# Patient Record
Sex: Male | Born: 1955 | Race: Black or African American | Hispanic: No | Marital: Single | Smoking: Never smoker
Health system: Southern US, Community
[De-identification: ages and names within clinical notes are randomized; demographics above are authoritative.]

## PROBLEM LIST (undated history)

## (undated) DIAGNOSIS — M109 Gout, unspecified: Secondary | ICD-10-CM

## (undated) DIAGNOSIS — M549 Dorsalgia, unspecified: Secondary | ICD-10-CM

---

## 2016-11-18 ENCOUNTER — Emergency Department (HOSPITAL_COMMUNITY): Payer: Medicare Other

## 2016-11-18 ENCOUNTER — Encounter (HOSPITAL_COMMUNITY): Payer: Self-pay | Admitting: *Deleted

## 2016-11-18 ENCOUNTER — Emergency Department (HOSPITAL_COMMUNITY)
Admission: EM | Admit: 2016-11-18 | Discharge: 2016-11-18 | Disposition: A | Discharge status: Home / Self Care | Payer: Medicare Other | Attending: Emergency Medicine | Admitting: Emergency Medicine

## 2016-11-18 DIAGNOSIS — M5442 Lumbago with sciatica, left side: Secondary | ICD-10-CM | POA: Diagnosis not present

## 2016-11-18 DIAGNOSIS — M545 Low back pain: Secondary | ICD-10-CM | POA: Diagnosis present

## 2016-11-18 DIAGNOSIS — R2681 Unsteadiness on feet: Secondary | ICD-10-CM | POA: Insufficient documentation

## 2016-11-18 HISTORY — DX: Gout, unspecified: M10.9

## 2016-11-18 MED ORDER — PREDNISONE 20 MG PO TABS
60.0000 mg | ORAL_TABLET | Freq: Once | ORAL | Status: AC
  Administered 2016-11-18: 60 mg via ORAL
  Filled 2016-11-18: qty 3

## 2016-11-18 MED ORDER — HYDROCODONE-ACETAMINOPHEN 5-325 MG PO TABS: 1.0000 | ORAL_TABLET | Freq: Four times a day (QID) | ORAL | 0 refills | Status: DC | PRN

## 2016-11-18 MED ORDER — KETOROLAC TROMETHAMINE 30 MG/ML IJ SOLN
30.0000 mg | Freq: Once | INTRAMUSCULAR | Status: AC
  Administered 2016-11-18: 30 mg via INTRAMUSCULAR
  Filled 2016-11-18: qty 1

## 2016-11-18 MED ORDER — HYDROCODONE-ACETAMINOPHEN 5-325 MG PO TABS
1.0000 | ORAL_TABLET | Freq: Once | ORAL | Status: AC
  Administered 2016-11-18: 1 via ORAL
  Filled 2016-11-18: qty 1

## 2016-11-18 MED ORDER — PREDNISONE 10 MG (21) PO TBPK: ORAL_TABLET | Freq: Every day | ORAL | 0 refills | Status: DC

## 2016-11-18 NOTE — ED Notes (Signed)
Pt reports having back/sciatic nerve pain for over 20 years. HE reports helping his daughter move some things in her yard and thinks that is what aggravated his pain. He reports taking 800 mg ibuprofen with no relief.

## 2016-11-18 NOTE — ED Provider Notes (Signed)
[facility] EMERGENCY DEPARTMENT Provider Note   CSN: [id number] Arrival date & time: 11/18/16  4098     History   Chief Complaint Chief Complaint  Patient presents with  . Back Pain    HPI Trevor Sanchez is a 61 y.o. male with a history of intermittent back and sciatic nerve pain for 20 years presents today for evaluation of acute pain.  He reports that he is visiting from [place] and he helped his daughter move some things in her yard a few days ago.  He reports that since then he has been having a increased pain.  He has been taking ibuprofen 800 without any relief.  He says that normally he gets pain relief from steroids, anti-inflammatories, and narcotic pain medicine.  He reports that his pain radiates down the back of his left leg.  He denies any numbness or tingling of his upper inner thighs or genitals.  No changes to bowel or bladder function.  He denies any personal history of cancer, no recent trauma, and denies history of IV drug use.  He denies any history of high blood pressure.  HPI  Past Medical History:  Diagnosis Date  . Gout     There are no active problems to display for this patient.   History reviewed. No pertinent surgical history.     Home Medications    Prior to Admission medications   Medication Sig Start Date End Date Taking? Authorizing Provider  HYDROcodone-acetaminophen (NORCO/VICODIN) 5-325 MG tablet Take 1 tablet by mouth every 6 (six) hours as needed for severe pain. 11/18/16   Cristina Gong, PA-C  predniSONE (STERAPRED UNI-PAK 21 TAB) 10 MG (21) TBPK tablet Take by mouth daily. Take by mouth 4 tabs for 1 day, then 3 tabs for 1 day, 2 tabs for 1 day, then 1 tab by mouth daily for 1 day 11/18/16   Cristina Gong, PA-C    Family History No family history on file.  Social History Social History  Substance Use Topics  . Smoking status: Never Smoker  . Smokeless tobacco: Never Used  . Alcohol use Yes   Comment: occ     Allergies   Patient has no known allergies.   Review of Systems Review of Systems  Constitutional: Negative for chills and fever.  Gastrointestinal: Negative for abdominal pain, nausea and vomiting.  Musculoskeletal: Positive for back pain and gait problem (Secondary to pain).  Skin: Negative for color change and wound.  Neurological: Negative for weakness and numbness.     Physical Exam Updated Vital Signs BP 135/83 (BP Location: Left Arm)   Pulse 89   Temp 98.5 F (36.9 C) (Oral)   Resp 16   Ht 5\' 8"  (1.727 m)   Wt 93 kg (205 lb)   SpO2 100%   BMI 31.17 kg/m   Physical Exam  Constitutional: He appears well-developed and well-nourished.  Appears uncomfortable anytime he moves  HENT:  Head: Normocephalic and atraumatic.  Cardiovascular: Normal rate and intact distal pulses.   No murmur heard. Bilateral lower extremities are warm perfused.  2+ DP and PT pulses bilaterally.  Abdominal: Soft. Bowel sounds are normal. He exhibits no distension. There is no tenderness.  Musculoskeletal:  Unable to re-create patient's pain with palpation of bilateral lumbar paraspinal muscles or midline lumbar palpation.  He has 5 out of 5 strength to bilateral lower ankles through dorsi and plantar flexion.  Left-sided knee flexion extension, and hip flexion and extension are  limited secondary to reported pain.  Neurological:  Sensation intact to bilateral lower extremities.  Skin: Skin is warm and dry. He is not diaphoretic.  Psychiatric: He has a normal mood and affect. His behavior is normal.  Nursing note and vitals reviewed.    ED Treatments / Results  Labs (all labs ordered are listed, but only abnormal results are displayed) Labs Reviewed - No data to display  EKG  EKG Interpretation None       Radiology Dg Lumbar Spine Complete  Result Date: 11/18/2016 CLINICAL DATA:  Chronic lumbago with left-sided radicular symptoms EXAM: LUMBAR SPINE - COMPLETE  4+ VIEW COMPARISON:  None. FINDINGS: Frontal, lateral, spot lumbosacral lateral, and bilateral oblique views were obtained. There are 5 non-rib-bearing lumbar type vertebral bodies. There is no fracture or spondylolisthesis. There is moderate disc space narrowing at L5-S1 with anterior osteophytes at L5 and S1. Other disc spaces appear normal. There is facet osteoarthritic change at L5-S1 bilaterally. IMPRESSION: Osteoarthritic change at L5-S1.  No fracture or spondylolisthesis. Electronically Signed   By: Bretta Bang III M.D.   On: 11/18/2016 09:39    Procedures Procedures  EMERGENCY DEPARTMENT ULTRASOUND  Study: Limited Retroperitoneal Ultrasound of the Abdominal Aorta.  INDICATIONS:Back pain and Age>55 Multiple views of the abdominal aorta were obtained in real-time from the diaphragmatic hiatus to the aortic bifurcation in transverse planes with a multi-frequency probe.  PERFORMED BY: Myself IMAGES ARCHIVED?: Yes LIMITATIONS:  Body habitus and Bowel gas INTERPRETATION:  No abdominal aortic aneurysm     Medications Ordered in ED Medications  predniSONE (DELTASONE) tablet 60 mg (not administered)  ketorolac (TORADOL) 30 MG/ML injection 30 mg (not administered)  HYDROcodone-acetaminophen (NORCO/VICODIN) 5-325 MG per tablet 1 tablet (not administered)     Initial Impression / Assessment and Plan / ED Course  I have reviewed the triage vital signs and the nursing notes.  Pertinent labs & imaging results that were available during my care of the patient were reviewed by me and considered in my medical decision making (see chart for details).    Patient with back pain.  No neurological deficits and normal neuro exam.  Patient can walk but states is painful.  No loss of bowel or bladder control.  No concern for cauda equina.  No fever, night sweats, weight loss, h/o cancer, IVDU.  Bedside point-of-care abdominal aortic ultrasound was performed without any obvious dilation or  aneurysms present.  These images were reviewed by Dr. Denton Lank who agreed with my interpretation.  RICE protocol and pain medicine indicated and discussed with patient.   Will also discharge with steroid Dosepak.  He was treated with Norco, prednisone, and Toradol in the ED as he appeared uncomfortable.   This patient was seen as a shared visit with Dr.Steinl who agreed with my plan.  Prior to the prescription of narcotic medications the [facility] PMP was queried.  Final Clinical Impressions(s) / ED Diagnoses   Final diagnoses:  Left-sided low back pain with left-sided sciatica, unspecified chronicity    New Prescriptions New Prescriptions   HYDROCODONE-ACETAMINOPHEN (NORCO/VICODIN) 5-325 MG TABLET    Take 1 tablet by mouth every 6 (six) hours as needed for severe pain.   PREDNISONE (STERAPRED UNI-PAK 21 TAB) 10 MG (21) TBPK TABLET    Take by mouth daily. Take by mouth 4 tabs for 1 day, then 3 tabs for 1 day, 2 tabs for 1 day, then 1 tab by mouth daily for 1 day     Cristina Gong,  PA-C 11/18/16 1109    Cathren LaineSteinl, Kevin, MD 11/18/16 [phone]

## 2016-11-18 NOTE — Discharge Instructions (Signed)
Please take Ibuprofen (Advil, motrin) and Tylenol (acetaminophen) to relieve your pain.  You may take up to 600 MG (3 pills) of normal strength ibuprofen every 8 hours as needed.  In between doses of ibuprofen you make take tylenol, up to 1,000 mg (two extra strength pills).  Do not take more than 3,000 mg tylenol in a 24 hour period.  Please check all medication labels as many medications such as pain and cold medications may contain tylenol.  Do not drink alcohol while taking these medications.  Do not take other NSAID'S while taking ibuprofen (such as aleve or naproxen).  Please take ibuprofen with food to decrease stomach upset.  Please do not take any NSAIDs, including ibuprofen, four 12 hours after receiving your Toradol shot.  Today you received medications that may make you sleepy or impair your ability to make decisions.  For the next 24 hours please do not drive, operate heavy machinery, care for a small child with out another adult present, or perform any activities that may cause harm to you or someone else if you were to fall asleep or be impaired.   You are being prescribed a medication which may make you sleepy. Please follow up of listed precautions for at least 24 hours after taking one dose.  Your narcotic pain medication may cause constipation.  Please take a stool softener while taking your Norco.  Please seek emergency medical care if you have any numbness or tingling of your upper inner thighs or genitals, along with any changes to bowel or bladder function.  Please follow-up with your back doctor when you return to [place].  Please be aware that prednisone may make you have an increased appetite.  If you are diabetic it may cause your sugar to elevate.

## 2016-11-18 NOTE — ED Triage Notes (Signed)
Pt states L lower back pain that radiates to L leg.  States difficulty ambulating d/t pain.

## 2017-01-22 ENCOUNTER — Encounter (HOSPITAL_COMMUNITY): Payer: Self-pay | Admitting: Emergency Medicine

## 2017-01-22 ENCOUNTER — Emergency Department (HOSPITAL_COMMUNITY)
Admission: EM | Admit: 2017-01-22 | Discharge: 2017-01-22 | Disposition: A | Discharge status: Home / Self Care | Payer: Medicare Other | Attending: Emergency Medicine | Admitting: Emergency Medicine

## 2017-01-22 DIAGNOSIS — M545 Low back pain: Secondary | ICD-10-CM | POA: Diagnosis present

## 2017-01-22 HISTORY — DX: Dorsalgia, unspecified: M54.9

## 2017-01-22 MED ORDER — PREDNISONE 10 MG (21) PO TBPK: ORAL_TABLET | ORAL | 0 refills | Status: AC

## 2017-01-22 MED ORDER — OXYCODONE-ACETAMINOPHEN 5-325 MG PO TABS
1.0000 | ORAL_TABLET | Freq: Once | ORAL | Status: AC
  Administered 2017-01-22: 1 via ORAL
  Filled 2017-01-22: qty 1

## 2017-01-22 MED ORDER — NAPROXEN 500 MG PO TABS: 500.0000 mg | ORAL_TABLET | Freq: Two times a day (BID) | ORAL | 0 refills | Status: AC

## 2017-01-22 NOTE — ED Provider Notes (Signed)
[facility] Provider Note   CSN: [id number] Arrival date & time: 01/22/17  1311     History   Chief Complaint Chief Complaint  Patient presents with  . Back Pain    HPI Trevor Sanchez is a 61 y.o. male who presents the emergency department complaining of mid to right sided back pain that started this morning. Patient states yesterday he was doing lifting and moving a lot with assisting his daughter around the house. States this morning he woke up with some discomfort that has been progressively worsening, no specific lifting yesterday with sudden onset of pain. Pain is described as being to lower back centrally to R sided. Has tried ibuprofen and a muscle relaxer at home without relief. Pain is worse with movement and walking. States he was seen for similar in the past and prednisone and percocet taken with significant relief of symptoms. Denies numbness, tingling, weakness, incontinence to bowel/bladder, fever, chills, IV drug use, or hx of cancer.   HPI  Past Medical History:  Diagnosis Date  . Back ache   . Gout   . Gout     There are no active problems to display for this patient.   History reviewed. No pertinent surgical history.     Home Medications    Prior to Admission medications   Medication Sig Start Date End Date Taking? Authorizing Provider  HYDROcodone-acetaminophen (NORCO/VICODIN) 5-325 MG tablet Take 1 tablet by mouth every 6 (six) hours as needed for severe pain. 11/18/16   Cristina GongHammond, Elizabeth W, PA-C  predniSONE (STERAPRED UNI-PAK 21 TAB) 10 MG (21) TBPK tablet Take by mouth daily. Take by mouth 4 tabs for 1 day, then 3 tabs for 1 day, 2 tabs for 1 day, then 1 tab by mouth daily for 1 day 11/18/16   Cristina GongHammond, Elizabeth W, PA-C    Family History History reviewed. No pertinent family history.  Social History Social History   Tobacco Use  . Smoking status: Never Smoker  . Smokeless tobacco: Never Used  Substance  Use Topics  . Alcohol use: Yes    Comment: occ  . Drug use: No     Allergies   Patient has no known allergies.   Review of Systems Review of Systems  Constitutional: Negative for chills, fever and unexpected weight change.  Musculoskeletal: Positive for back pain.  Neurological: Negative for weakness and numbness.       Negative for incontinence     Physical Exam Updated Vital Signs BP (!) 149/105 (BP Location: Right Arm)   Pulse 76   Temp 98.9 F (37.2 C) (Oral)   Resp 17   Ht 5\' 8"  (1.727 m)   Wt 93 kg (205 lb)   SpO2 99%   BMI 31.17 kg/m   Physical Exam  Constitutional: He appears well-developed and well-nourished. No distress.  HENT:  Head: Normocephalic and atraumatic.  Eyes: Conjunctivae are normal. Right eye exhibits no discharge. Left eye exhibits no discharge.  Cardiovascular:  Pulses:      Posterior tibial pulses are 2+ on the right side, and 2+ on the left side.  Musculoskeletal:  Back: Patient with diffuse lower lumbar tenderness from midline to right paraspinal. No point tenderness.  No overlying erythema, swelling, ecchymosis, warmth, or obvious deformity.   Neurological: He is alert.  Clear speech. Bilateral lower extremities' sensation intact to sharp and dull touch.. 5/5 plantar and dorsi flexion bilaterally. Patellar DTRs are 2+ and symmetric . Gait is intact.  Psychiatric: He has a normal mood and affect. His behavior is normal. Thought content normal.  Nursing note and vitals reviewed.   ED Treatments / Results  Labs (all labs ordered are listed, but only abnormal results are displayed) Labs Reviewed - No data to display  EKG  EKG Interpretation None       Radiology No results found.  Procedures Procedures (including critical care time)  Medications Ordered in ED Medications - No data to display   Initial Impression / Assessment and Plan / ED Course  I have reviewed the triage vital signs and the nursing notes.  Pertinent  labs & imaging results that were available during my care of the patient were reviewed by me and considered in my medical decision making (see chart for details).   Patient presents with complaint of back pain after being active with lifting yesterday.  Patient is nontoxic-appearing stable vital signs, noted to be  Hypertensive-discussed the patient that he needs to have this rechecked.  Patient with lumbar tenderness midline extending to right side paraspinal muscles, no focal tenderness, doubt fracture or dislocation.  Discussed that I have low suspicion for fracture or dislocation with the patient, we discussed the option of getting an x-ray, he declined at this time and I am in agreement with this.  No neurological deficits and normal neuro exam.  Patient can walk but states is painful.  No loss of bowel or bladder control.  No concern for cauda equina.  No fever, night sweats, weight loss, h/o cancer, IVDU.  Will give one percocet in the ED and treat with prednisone and Naproxen. Instructed patient to take muscle relaxer he has at home and apply heat to the area- discussed he is not to drive or operate heavy machinery with muscle relaxer use. I discussed treatment plan, need for PCP follow-up, and return precautions with the patient. Provided opportunity for questions, patient confirmed understanding and is in agreement with plan.   Final Clinical Impressions(s) / ED Diagnoses   Final diagnoses:  Acute low back pain, unspecified back pain laterality, with sciatica presence unspecified    ED Discharge Orders        Ordered    predniSONE (STERAPRED UNI-PAK 21 TAB) 10 MG (21) TBPK tablet     01/22/17 1508    naproxen (NAPROSYN) 500 MG tablet  2 times daily     01/22/17 [address]1508       Kristiane Morsch, SheridanSamantha R, PA-C 01/22/17 1510    Margarita Grizzleay, Danielle, MD 01/25/17 [phone]

## 2017-01-22 NOTE — ED Triage Notes (Signed)
Pt. Stated, I was helping my daughter move and I HURT MY LOWER BACFK.

## 2017-01-22 NOTE — Discharge Instructions (Signed)
Back Pain:  I have prescribed you an non-steroidal anti-inflammatory medication and a steroid  Naproxen is a nonsteroidal anti-inflammatory medication that will help with pain and swelling. Be sure to take this medication as prescribed with food, 1 pill every 12 hours,  It should be taken with food, as it can cause stomach upset, and more seriously, stomach bleeding. Do not take other nonsteroidal anti-inflammatory medications with this such as Advil, Motrin, or Aleve.   Prednisone is a steroid that will help with pain and inflammation.   Continue to take your prescribed muscle relaxer for pain control as well- keep in mind you cannot driver or operate heavy machinery when taking this.   In addition you may also take Tylenol. Tylenol is generally safe, though you should not take more than 8 of the extra strength (500mg ) pills a day.  The application of heat can help soothe the pain.  Maintaining your daily activities, including walking, is encourged, as it will help you get better faster than just staying in bed.   Your pain should get better over the next 2 weeks.  You will need to follow up with  Your primary healthcare provider in 1 week for reassessment, if you do not have a primary care provider one is provided in your discharge instructions. However if you develop severe or worsening pain, low back pain with fever, numbness, weakness, loss of bowel or bladder control, or inability to walk or urinate, you should return to the ER immediately.  Please follow up with your doctor this week for a recheck if still having symptoms.   Additionally your blood pressure was elevated in the emergency department today at 149/105 please have your PCP recheck this.

## 2019-10-15 IMAGING — DX DG LUMBAR SPINE COMPLETE 4+V
6 series · 6 of 6 positions shown · non-contrast
Comparison: None.

CLINICAL DATA: Chronic lumbago with left-sided radicular symptoms

EXAM:
LUMBAR SPINE - COMPLETE 4+ VIEW

[t lumbar spine ap]
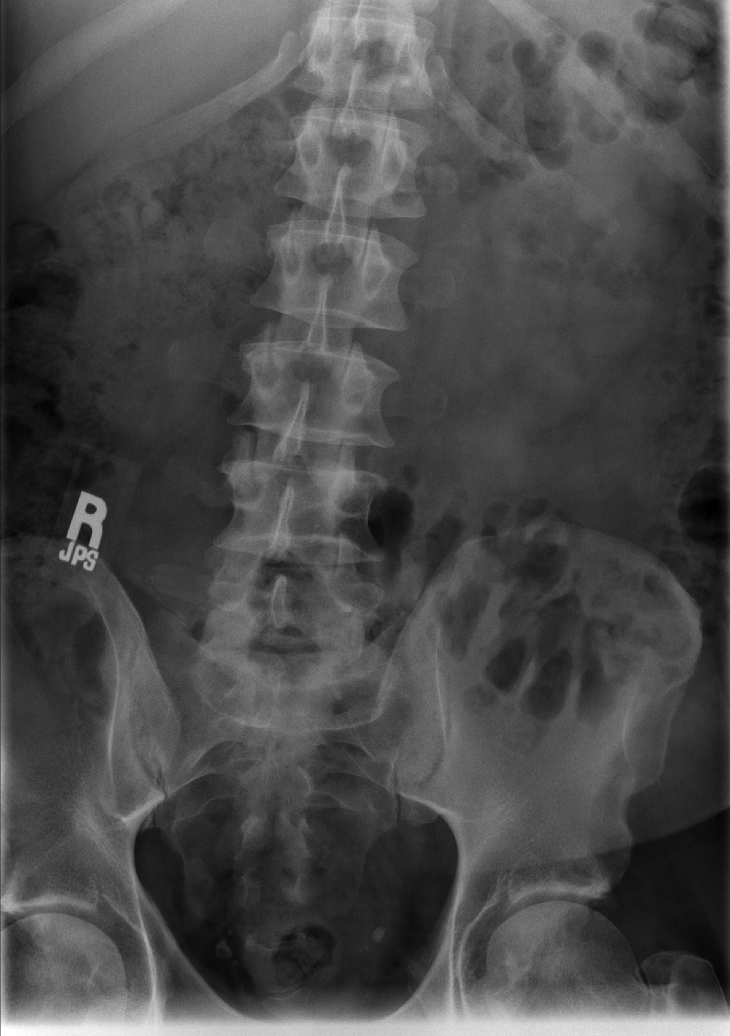

[t lumbar spine obl (1 of 2)]
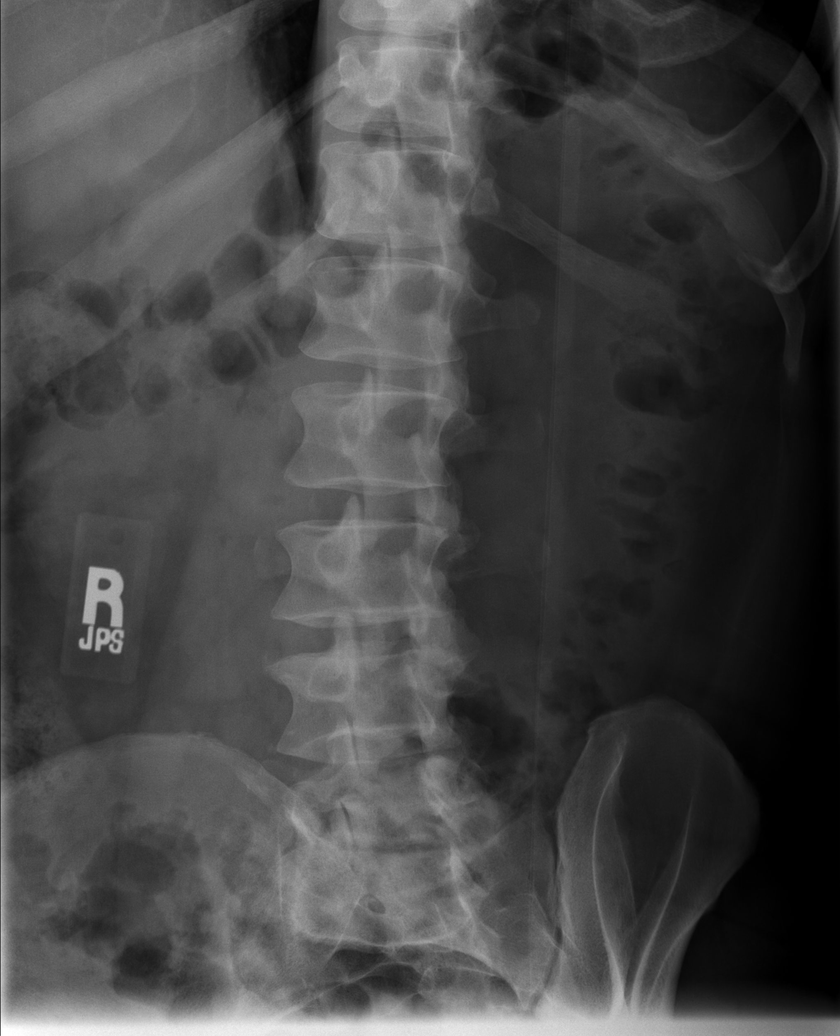

[t lumbar spine obl (2 of 2)]
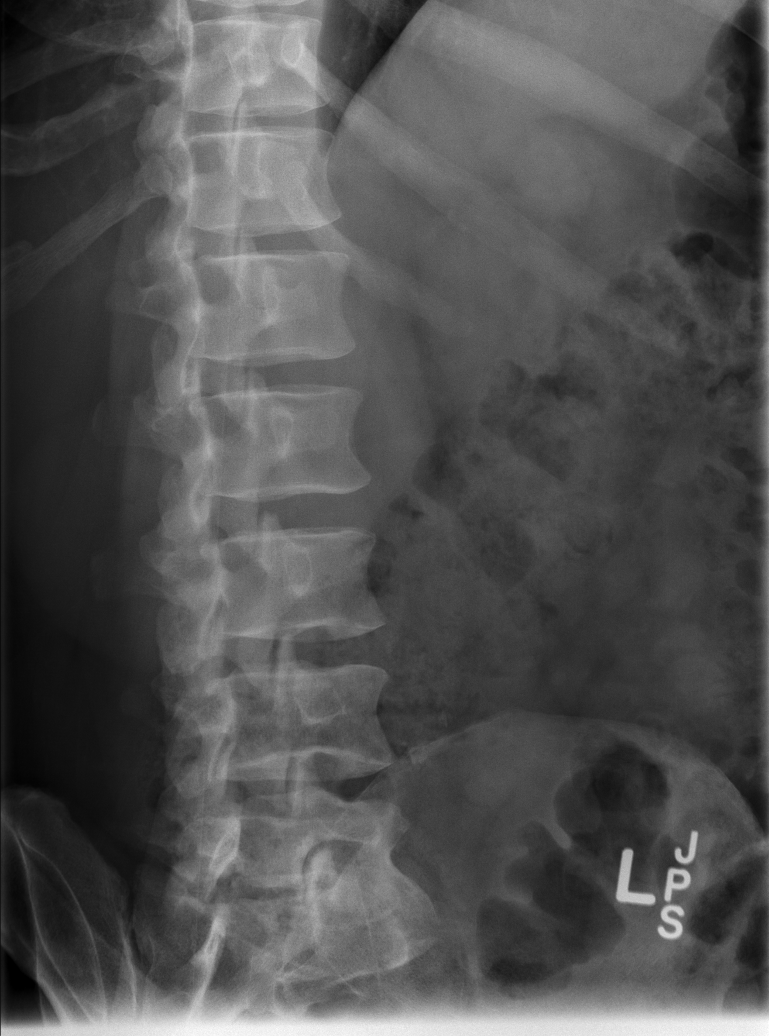

[t lumbar spine lat]
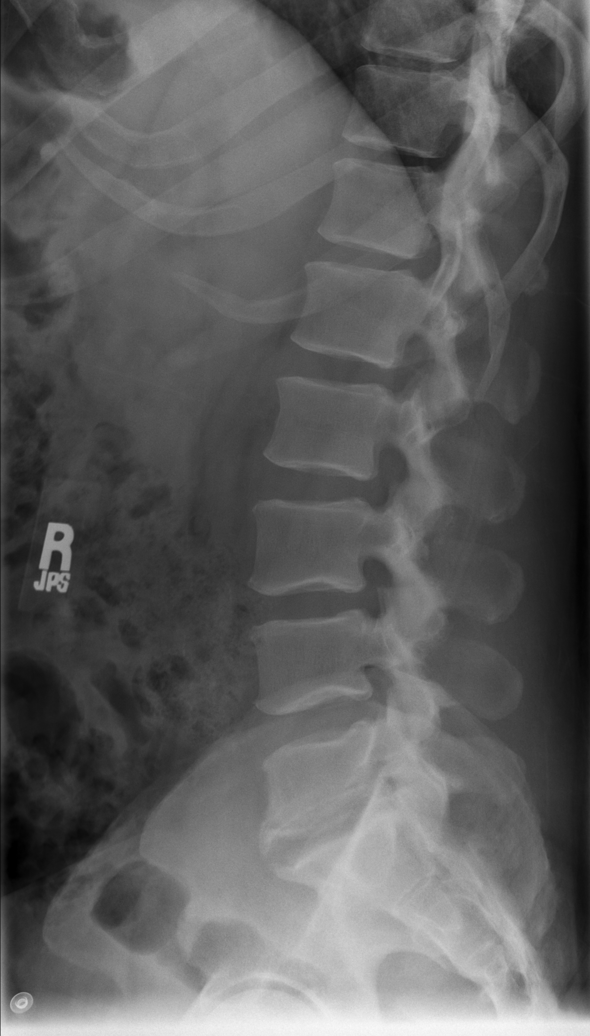

[t lumbar l-5 s-1 spot (1 of 2)]
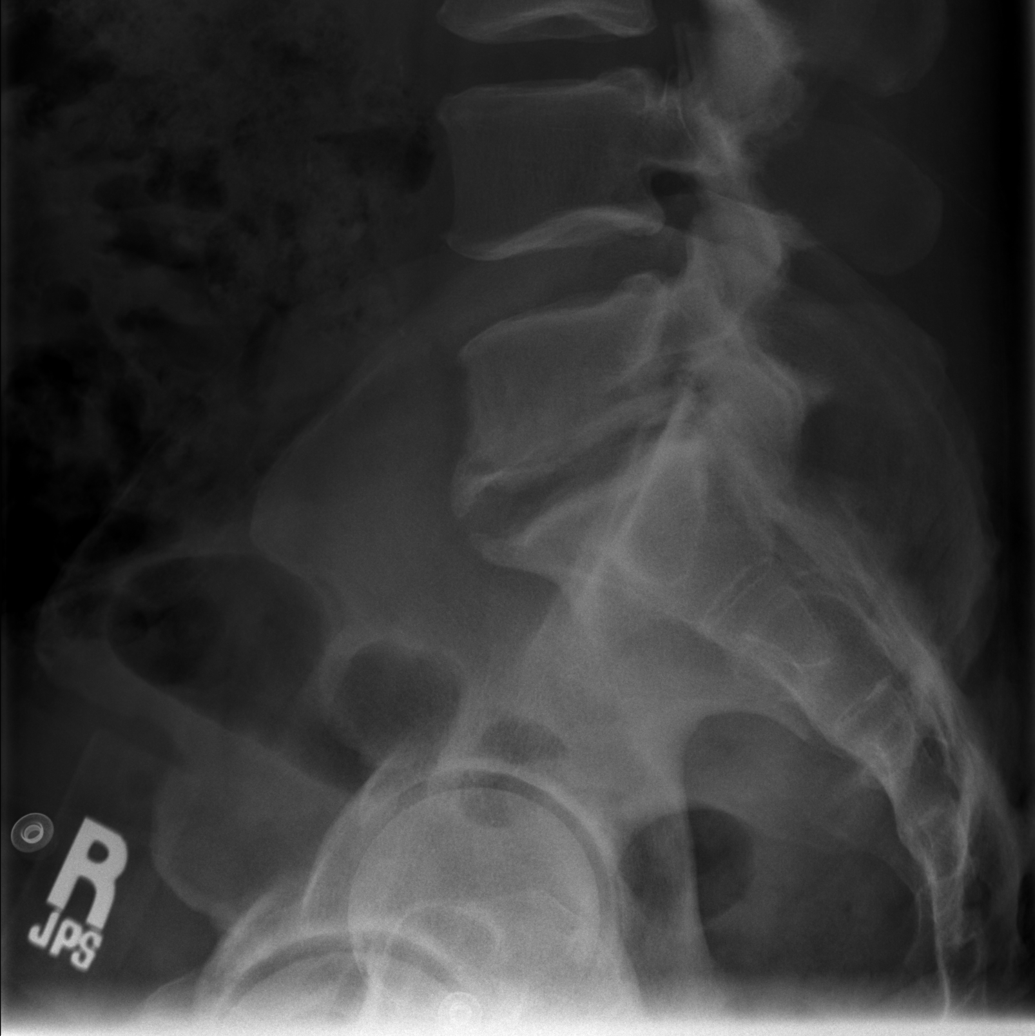

[t lumbar l-5 s-1 spot (2 of 2)]
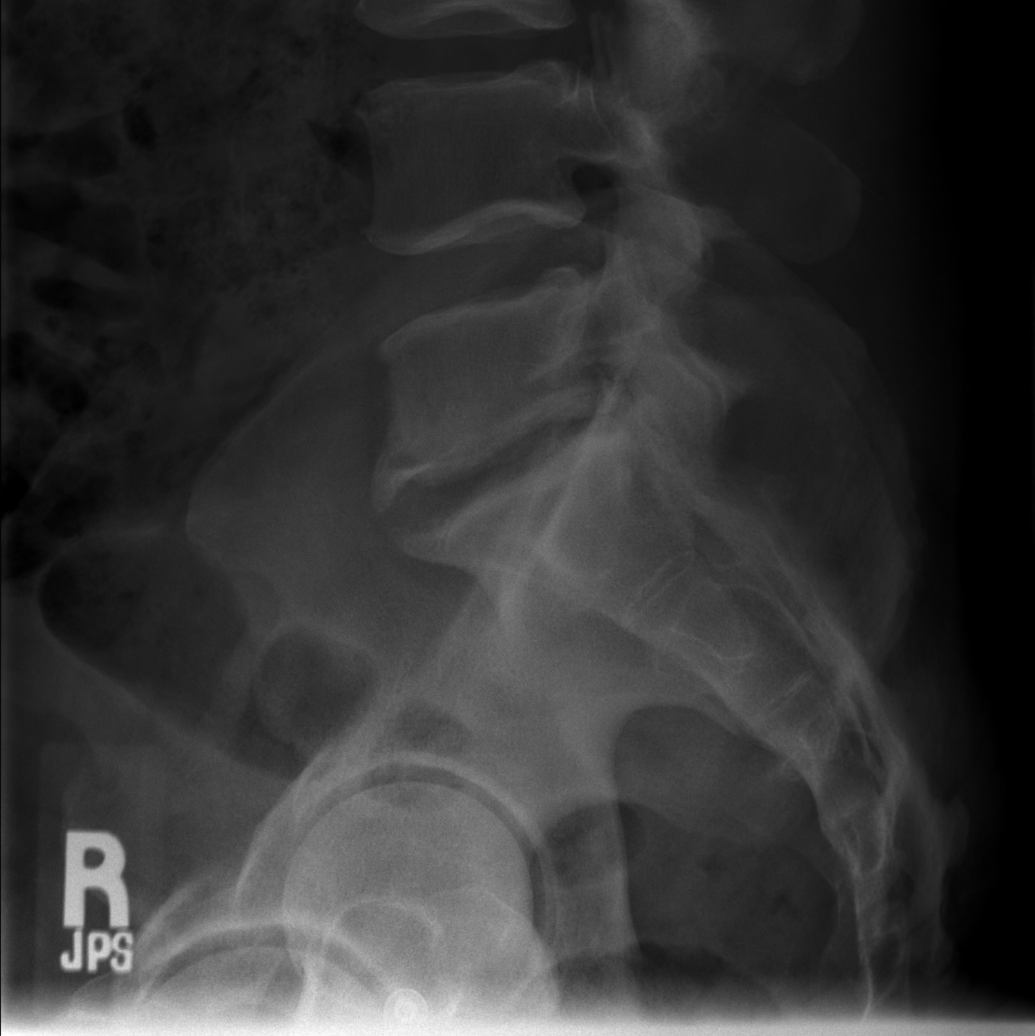

[6 of 6 positions shown; findings below may reference images not displayed]

FINDINGS: Frontal, lateral, spot lumbosacral lateral, and bilateral oblique
views were obtained. There are 5 non-rib-bearing lumbar type
vertebral bodies. There is no fracture or spondylolisthesis. There
is moderate disc space narrowing at L5-S1 with anterior osteophytes
at L5 and S1. Other disc spaces appear normal. There is facet
osteoarthritic change at L5-S1 bilaterally.
IMPRESSION: Osteoarthritic change at L5-S1.  No fracture or spondylolisthesis.

## 2022-01-22 ENCOUNTER — Emergency Department (HOSPITAL_COMMUNITY)
Admission: EM | Admit: 2022-01-22 | Discharge: 2022-01-22 | Disposition: A | Discharge status: Home / Self Care | Payer: Medicaid - Out of State | Attending: Emergency Medicine | Admitting: Emergency Medicine

## 2022-01-22 DIAGNOSIS — Z7901 Long term (current) use of anticoagulants: Secondary | ICD-10-CM | POA: Insufficient documentation

## 2022-01-22 DIAGNOSIS — R040 Epistaxis: Secondary | ICD-10-CM | POA: Insufficient documentation

## 2022-01-22 DIAGNOSIS — Z86711 Personal history of pulmonary embolism: Secondary | ICD-10-CM | POA: Insufficient documentation

## 2022-01-22 MED ORDER — LISINOPRIL 10 MG PO TABS
40.0000 mg | ORAL_TABLET | Freq: Once | ORAL | Status: AC
  Administered 2022-01-22: 40 mg via ORAL
  Filled 2022-01-22: qty 4

## 2022-01-22 MED ORDER — OXYMETAZOLINE HCL 0.05 % NA SOLN
1.0000 | Freq: Once | NASAL | Status: AC
  Administered 2022-01-22: 1 via NASAL
  Filled 2022-01-22: qty 30

## 2022-01-22 NOTE — Discharge Instructions (Signed)
As we discussed, I recommend that you keep the packing in your nose to ensure that it does not start bleeding again.  I also recommend that you use the Afrin spray if bleeding resumes.  If you are unable to get any additional bleeding under control, you will need to return for evaluation.  Return if development of any new or worsening symptoms.

## 2022-01-22 NOTE — ED Triage Notes (Signed)
Patient BIB [facility] for nose bleed and also states his right eye was bleeding. No injury, has history of HTN and on Lisinopril. 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 BP 174/96 per EMS. Patient alert and oriented x 4. Denies pain. JRPRN

## 2022-01-22 NOTE — ED Provider Notes (Signed)
[facility]-EMERGENCY DEPT Provider Note   CSN: [id number] Arrival date & time: 01/22/22  1354     History  No chief complaint on file.   Trevor Sanchez is a 66 y.o. male.  Patient with history of PE on Elliquis, hypertension presents today with complaints of epistaxis. He states that same began immediately prior to arrival today when he sneezed and his right nare started bleeding. He states that this has happened before and he immediately plugged his nose with tissue and was able to achieve hemostasis. He states that after that his right eye started dripping blood which was concerning for him and prompted his visit today. States that after his eye started bleeding he pulled the tissue out of his nose and the bleeding resumed. He denies any pain, fevers, chills, lightheadedness, or shortness of breath. Patient also states he was concerned that his blood pressure was high however he did not take his medication today.  The history is provided by the patient. No language interpreter was used.       Home Medications Prior to Admission medications   Medication Sig Start Date End Date Taking? Authorizing Provider  naproxen (NAPROSYN) 500 MG tablet Take 1 tablet (500 mg total) by mouth 2 (two) times daily. 01/22/17   Petrucelli, Samantha R, PA-C  predniSONE (STERAPRED UNI-PAK 21 TAB) 10 MG (21) TBPK tablet 6, 5, 4, 3, 2, 1 Take as written 01/22/17   Petrucelli, Samantha R, PA-C      Allergies    Patient has no known allergies.    Review of Systems   Review of Systems  HENT:  Positive for nosebleeds.   All other systems reviewed and are negative.   Physical Exam Updated Vital Signs BP (!) 147/93   Pulse 66   Temp 98.8 F (37.1 C) (Oral)   Resp 16   Ht 5\' 8"  (1.727 m)   Wt 93 kg   SpO2 96%   BMI 31.17 kg/m  Physical Exam Vitals and nursing note reviewed.  Constitutional:      General: He is not in acute distress.    Appearance: Normal appearance. He is  normal weight. He is not ill-appearing, toxic-appearing or diaphoretic.  HENT:     Head: Normocephalic and atraumatic.     Nose:     Comments: Dried blood present in the right nare without signs of active bleeding.     Mouth/Throat:     Comments: No blood present in the oropharynx Eyes:     Comments: No active bleeding from the eyes  Cardiovascular:     Rate and Rhythm: Normal rate.  Pulmonary:     Effort: Pulmonary effort is normal. No respiratory distress.  Musculoskeletal:        General: Normal range of motion.     Cervical back: Normal range of motion.  Skin:    General: Skin is warm and dry.  Neurological:     General: No focal deficit present.     Mental Status: He is alert.  Psychiatric:        Mood and Affect: Mood normal.        Behavior: Behavior normal.     ED Results / Procedures / Treatments   Labs (all labs ordered are listed, but only abnormal results are displayed) Labs Reviewed - No data to display  EKG None  Radiology No results found.  Procedures Procedures    Medications Ordered in ED Medications  oxymetazoline (AFRIN) 0.05 % nasal spray  1 spray (1 spray Each Nare Given 01/22/22 1623)    ED Course/ Medical Decision Making/ A&P                           Medical Decision Making Risk OTC drugs.   Patient with history of PE on Elliquis presents today with complaints of epistaxis after sneezing immediately PTA.  He is afebrile, nontoxic-appearing, and in no acute distress with reassuring vital signs.  Physical exam reveals no signs of active bleeding.  Patient given Afrin spray and monitored over time to ensure continued hemostasis.  Patient also given dose of his home blood pressure medication per his request as he missed his dose this morning.  After several hours of monitoring, patient continues to have no active epistaxis. Will send home with Afrin spray, patient given instructions for use.  Considered further evaluation with labs, however  patient states bleeding was minimal and is not having any symptoms of anemia, therefore will defer testing at this time.  Patient is stable for discharge.  He is understanding and amenable with plan, educated on red flag symptoms of prompt immediate return.  Patient discharged in stable condition.  Final Clinical Impression(s) / ED Diagnoses Final diagnoses:  Epistaxis    Rx / DC Orders ED Discharge Orders     None     An After Visit Summary was printed and given to the patient.     Vear Clock 01/22/22 1908    Tegeler, Canary Brim, MD 01/23/22 1447
